# Patient Record
Sex: Male | Born: 1980 | Race: White | Hispanic: No | Marital: Married | Smoking: Never smoker
Health system: Southern US, Community
[De-identification: ages and names within clinical notes are randomized; demographics above are authoritative.]

---

## 2008-02-11 ENCOUNTER — Ambulatory Visit: Payer: Self-pay | Admitting: Family Medicine

## 2008-02-14 LAB — CONVERTED CEMR LAB
Cholesterol: 182 mg/dL (ref 0–200)
Glucose, Bld: 85 mg/dL (ref 70–99)

## 2011-06-09 ENCOUNTER — Other Ambulatory Visit (INDEPENDENT_AMBULATORY_CARE_PROVIDER_SITE_OTHER): Payer: Self-pay

## 2011-06-09 DIAGNOSIS — R5381 Other malaise: Secondary | ICD-10-CM

## 2011-06-09 DIAGNOSIS — R5383 Other fatigue: Secondary | ICD-10-CM

## 2011-06-09 DIAGNOSIS — Z13220 Encounter for screening for lipoid disorders: Secondary | ICD-10-CM

## 2011-06-09 LAB — CBC WITH DIFFERENTIAL/PLATELET
Basophils Relative: 0.6 % (ref 0.0–3.0)
Eosinophils Absolute: 0.1 10*3/uL (ref 0.0–0.7)
Eosinophils Relative: 2.3 % (ref 0.0–5.0)
Hemoglobin: 14.6 g/dL (ref 13.0–17.0)
Lymphocytes Relative: 34.1 % (ref 12.0–46.0)
MCHC: 34.3 g/dL (ref 30.0–36.0)
MCV: 96.6 fl (ref 78.0–100.0)
Monocytes Absolute: 0.4 10*3/uL (ref 0.1–1.0)
Neutro Abs: 2.6 10*3/uL (ref 1.4–7.7)
Neutrophils Relative %: 54.5 % (ref 43.0–77.0)
RBC: 4.39 Mil/uL (ref 4.22–5.81)
WBC: 4.8 10*3/uL (ref 4.5–10.5)

## 2011-06-09 LAB — LIPID PANEL
Cholesterol: 176 mg/dL (ref 0–200)
HDL: 39.5 mg/dL (ref >39.00)
LDL Cholesterol: 110 mg/dL — ABNORMAL HIGH (ref 0–99)
Triglycerides: 134 mg/dL (ref 0.0–149.0)
VLDL: 26.8 mg/dL (ref 0.0–40.0)

## 2011-06-09 LAB — COMPREHENSIVE METABOLIC PANEL
AST: 92 U/L — ABNORMAL HIGH (ref 0–37)
Albumin: 4.2 g/dL (ref 3.5–5.2)
Alkaline Phosphatase: 37 U/L — ABNORMAL LOW (ref 39–117)
BUN: 16 mg/dL (ref 6–23)
Creatinine, Ser: 1 mg/dL (ref 0.4–1.5)
Glucose, Bld: 88 mg/dL (ref 70–99)
Potassium: 4.5 mEq/L (ref 3.5–5.1)

## 2011-06-10 ENCOUNTER — Encounter: Payer: Self-pay | Admitting: Family Medicine

## 2011-06-11 ENCOUNTER — Ambulatory Visit (INDEPENDENT_AMBULATORY_CARE_PROVIDER_SITE_OTHER): Payer: 59 | Admitting: Family Medicine

## 2011-06-11 ENCOUNTER — Encounter: Payer: Self-pay | Admitting: Family Medicine

## 2011-06-11 VITALS — BP 120/80 | HR 76 | Temp 98.7°F | Ht 70.0 in | Wt 202.0 lb

## 2011-06-11 DIAGNOSIS — R7989 Other specified abnormal findings of blood chemistry: Secondary | ICD-10-CM

## 2011-06-11 DIAGNOSIS — Z0000 Encounter for general adult medical examination without abnormal findings: Secondary | ICD-10-CM

## 2011-06-11 DIAGNOSIS — N139 Obstructive and reflux uropathy, unspecified: Secondary | ICD-10-CM

## 2011-06-11 LAB — POCT URINALYSIS DIPSTICK
Glucose, UA: NEGATIVE
Leukocytes, UA: NEGATIVE
Nitrite, UA: NEGATIVE
Protein, UA: NEGATIVE
Urobilinogen, UA: 0.2

## 2011-06-11 NOTE — Patient Instructions (Signed)
REFERRAL: GO THE THE FRONT ROOM AT THE ENTRANCE OF OUR CLINIC, NEAR CHECK IN. ASK FOR Harry Lowe. SHE WILL HELP YOU SET UP YOUR REFERRAL. DATE: TIME:  

## 2011-06-11 NOTE — Progress Notes (Signed)
Patient Name: Harry Lowe Date of Birth: Jul 31, 1980 Age: 31 y.o. Medical Record Number: [id number] Gender: male Date of Encounter: 06/11/2011  History of Present Illness:  Harry Lowe is a 31 y.o. very pleasant male patient who presents with the following:  Preventative Health Maintenance Visit:  Health Maintenance Summary Reviewed and updated, unless pt declines services.  Tobacco History Reviewed. Alcohol: less than a 12 pack in 1 month Exercise Habits: Some activity, rec at least 30 mins 5 times a week STD concerns: no risk or activity to increase risk Drug Use: None Encouraged self-testicular check  Labs reviewed with the patient.   Lipids:    Component Value Date/Time   CHOL 176 06/09/2011 0813   TRIG 134.0 06/09/2011 0813   HDL 39.50 06/09/2011 0813   VLDL 26.8 06/09/2011 0813   CHOLHDL 4 06/09/2011 0813    CBC:    Component Value Date/Time   WBC 4.8 06/09/2011 0813   HGB 14.6 06/09/2011 0813   HCT 42.4 06/09/2011 0813   PLT 141.0* 06/09/2011 0813   MCV 96.6 06/09/2011 0813   NEUTROABS 2.6 06/09/2011 0813   LYMPHSABS 1.6 06/09/2011 0813   MONOABS 0.4 06/09/2011 0813   EOSABS 0.1 06/09/2011 0813   BASOSABS 0.0 06/09/2011 0813    Basic Metabolic Panel:    Component Value Date/Time   NA 141 06/09/2011 0813   K 4.5 06/09/2011 0813   CL 106 06/09/2011 0813   CO2 30 06/09/2011 0813   BUN 16 06/09/2011 0813   CREATININE 1.0 06/09/2011 0813   GLUCOSE 88 06/09/2011 0813   CALCIUM 9.0 06/09/2011 0813    Lab Results  Component Value Date   ALT 67* 06/09/2011   AST 92* 06/09/2011   ALKPHOS 37* 06/09/2011   BILITOT 0.4 06/09/2011    Elevated: LFT's Chol was high on insurance physical. He drinks less than a 12 pack a month. No over-the-counter medications, herbal medications. No history of IV drug use. No history of nasal cocaine in the past or any other nasal medication ingestions. No STD exposure that he knows of or can recall. He is happily married. Monogamous. His liver  function tests were elevated on a recent insurance physical, but previously he is never had any elevation to his knowledge. On his last exam we did not check his liver panel. Excedrin with tylenol at least 2-3 times a week  Outflow restriction? Some sensation like needs to pea, feels like needs to go.  No STD exposure.  For a few months. No change in stream ? If something remains in his bladder This mostly happens at nighttime when he tries to empty his bladder. He will have to get up multiple times at bedtime, often 3, 4, or 5 times to try to get his bladder, and much of the time he feels as if he is not able to fully and his bladder. This is new and has been ongoing for the last 3-6 months.  Less than a 12 pack a month Has been taking some excedrin 2-3 times a week Daily vitamin No IV drugs.    Past Medical History, Surgical History, Social History, Family History, Problem List, Medications, and Allergies have been reviewed and updated if relevant.  Review of Systems:  General: Denies fever, chills, sweats. No significant weight loss. Eyes: Denies blurring,significant itching ENT: Denies earache, sore throat, and hoarseness. Cardiovascular: Denies chest pains, palpitations, dyspnea on exertion Respiratory: Denies cough, dyspnea at rest,wheeezing Breast: no concerns about lumps GI: Denies nausea, vomiting, diarrhea, constipation,  change in bowel habits, abdominal pain, melena, hematochezia GU: Denies penile discharge, ED, urinary flow / outflow problems. No STD concerns. Musculoskeletal: Denies back pain, joint pain Derm: Denies rash, itching Neuro: Denies  paresthesias, frequent falls, frequent headaches Psych: Denies depression, anxiety Endocrine: Denies cold intolerance, heat intolerance, polydipsia Heme: Denies enlarged lymph nodes Allergy: No hayfever   Physical Examination: Filed Vitals:   06/11/11 1437  BP: 120/80  Pulse: 76  Temp: 98.7 F (37.1 C)  TempSrc: Oral    Height: 5\' 10"  (1.778 m)  Weight: 202 lb (91.627 kg)    Body mass index is 28.98 kg/(m^2).   Wt Readings from Last 3 Encounters:  06/11/11 202 lb (91.627 kg)  02/11/08 179 lb 6.1 oz (81.367 kg)    GEN: well developed, well nourished, no acute distress Eyes: conjunctiva and lids normal, PERRLA, EOMI ENT: TM clear, nares clear, oral exam WNL Neck: supple, no lymphadenopathy, no thyromegaly, no JVD Pulm: clear to auscultation and percussion, respiratory effort normal CV: regular rate and rhythm, S1-S2, no murmur, rub or gallop, no bruits, peripheral pulses normal and symmetric, no cyanosis, clubbing, edema or varicosities Chest: no scars, masses GI: soft, non-tender; no hepatosplenomegaly, masses; active bowel sounds all quadrants GU: no hernia, testicular mass, penile discharge Lymph: no cervical, axillary or inguinal adenopathy MSK: gait normal, muscle tone and strength WNL, no joint swelling, effusions, discoloration, crepitus  SKIN: clear, good turgor, color WNL, no rashes, lesions, or ulcerations Neuro: normal mental status, normal strength, sensation, and motion Psych: alert; oriented to person, place and time, normally interactive and not anxious or depressed in appearance.   Assessment and Plan:  1. Routine general medical examination at a health care facility    2. Elevated LFTs : unclear cause. Initiate hepatitis workup. Ultrasound of the abdomen to evaluate for potential  Elevation in liver function tests. Advised weight for potential fatty liver or other occult change in the liver texture that could be causing an elevation in liver function testing. Hepatitis B core antibody, IgM, Hepatitis B surface antibody, Hepatitis B surface antigen, Hepatitis C antibody, US Abdomen Complete, POCT urinalysis dipstick  3. Urinary outflow obstruction : historically, sounds like a  Outflow obstruction, intermittently causing the patient some significant symptoms. Ambulatory referral to  Urology    Orders Today: Orders Placed This Encounter  Procedures  . US Abdomen Complete    Standing Status: Future     Number of Occurrences:      Standing Expiration Date: 08/10/2012    202 LB/NO NEEDS PER OFFICE/NOT DIABETIC/INS/UHC/TA/[facility] W/EPIC ORDER    Order Specific Question:  Reason for exam:    Answer:  elevated lft's, eval liver and gallbladder    Order Specific Question:  Preferred imaging location?    Answer:  [facility]  . Hepatitis B core antibody, IgM  . Hepatitis B surface antibody  . Hepatitis B surface antigen  . Hepatitis C antibody  . Ambulatory referral to Urology    Referral Priority:  Routine    Referral Type:  Consultation    Referral Reason:  Specialty Services Required    Requested Specialty:  Urology    Number of Visits Requested:  1  . POCT urinalysis dipstick   Urinalysis    Component Value Date/Time   BILIRUBINUR neg 06/11/2011 1538   UROBILINOGEN 0.2 06/11/2011 1538   NITRITE neg 06/11/2011 1538   LEUKOCYTESUR Negative 06/11/2011 1538   The patient's preventative maintenance and recommended screening tests for an annual wellness exam were  reviewed in full today. Brought up to date unless services declined.  Counselled on the importance of diet, exercise, and its role in overall health and mortality. The patient's FH and SH was reviewed, including their home life, tobacco status, and drug and alcohol status.

## 2011-06-12 LAB — HEPATITIS B SURFACE ANTIBODY,QUALITATIVE: Hep B S Ab: POSITIVE — AB

## 2011-06-12 LAB — HEPATITIS B SURFACE ANTIGEN: Hepatitis B Surface Ag: NEGATIVE

## 2011-06-18 ENCOUNTER — Other Ambulatory Visit: Payer: 59

## 2011-06-25 ENCOUNTER — Ambulatory Visit
Admission: RE | Admit: 2011-06-25 | Discharge: 2011-06-25 | Disposition: A | Discharge status: Home / Self Care | Payer: 59 | Source: Ambulatory Visit | Attending: Family Medicine | Admitting: Family Medicine

## 2011-07-16 ENCOUNTER — Telehealth: Payer: Self-pay

## 2011-07-16 NOTE — Telephone Encounter (Signed)
Plain guaifenesin should be fine

## 2011-07-16 NOTE — Telephone Encounter (Signed)
Pt has elevated liver enzymes and is cautious with OTC meds. Pt has head congestion, raw throat due to drainage back of throat, occasional non productive cough, no fever. Pt not using nasal saline or any med. Pt wants Dr Brayton Layman advice of OTC he could take. Pts pharmacy [facility] if pharmacy needed. Pt contact # [phone].

## 2011-07-16 NOTE — Telephone Encounter (Signed)
Patient advised of recommendations.  

## 2011-07-25 ENCOUNTER — Other Ambulatory Visit: Payer: Self-pay | Admitting: Family Medicine

## 2011-07-25 DIAGNOSIS — R748 Abnormal levels of other serum enzymes: Secondary | ICD-10-CM

## 2011-07-31 ENCOUNTER — Other Ambulatory Visit: Payer: 59

## 2011-08-01 ENCOUNTER — Other Ambulatory Visit (INDEPENDENT_AMBULATORY_CARE_PROVIDER_SITE_OTHER): Payer: 59

## 2011-08-01 DIAGNOSIS — R748 Abnormal levels of other serum enzymes: Secondary | ICD-10-CM

## 2011-08-01 DIAGNOSIS — R7401 Elevation of levels of liver transaminase levels: Secondary | ICD-10-CM

## 2011-08-01 LAB — HEPATIC FUNCTION PANEL
AST: 34 U/L (ref 0–37)
Albumin: 4.4 g/dL (ref 3.5–5.2)
Alkaline Phosphatase: 47 U/L (ref 39–117)
Bilirubin, Direct: 0 mg/dL (ref 0.0–0.3)

## 2011-08-04 ENCOUNTER — Encounter: Payer: Self-pay | Admitting: *Deleted

## 2011-09-29 ENCOUNTER — Encounter: Payer: Self-pay | Admitting: Radiology

## 2011-09-29 DIAGNOSIS — R748 Abnormal levels of other serum enzymes: Secondary | ICD-10-CM | POA: Insufficient documentation

## 2011-09-29 DIAGNOSIS — R945 Abnormal results of liver function studies: Secondary | ICD-10-CM | POA: Insufficient documentation

## 2011-09-29 DIAGNOSIS — R7989 Other specified abnormal findings of blood chemistry: Secondary | ICD-10-CM | POA: Insufficient documentation

## 2012-07-24 IMAGING — US US ABDOMEN COMPLETE
1 series · 14 of 25 positions shown · non-contrast
Comparison: None.

CLINICAL DATA: Elevated LFTs, evaluate liver and gallbladder

COMPLETE ABDOMINAL ULTRASOUND

[Series 1: us abdomen complete · 0.26mm/px · 14 of 59 slices shown]
[im 1/59]
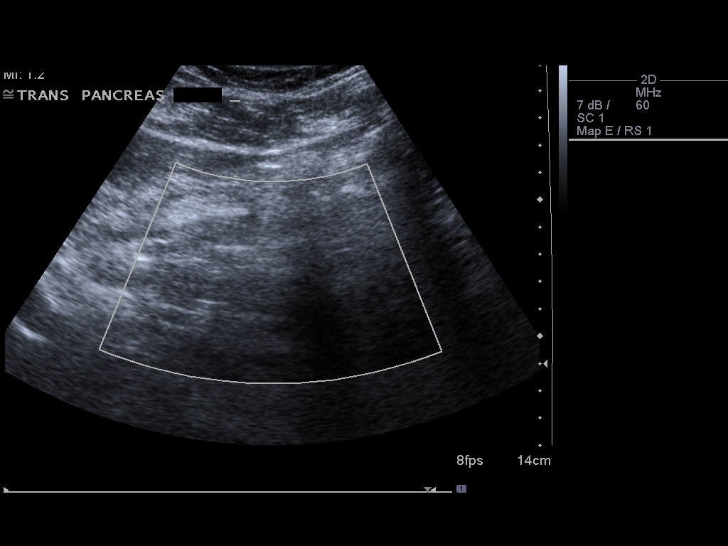
[im 5/59]
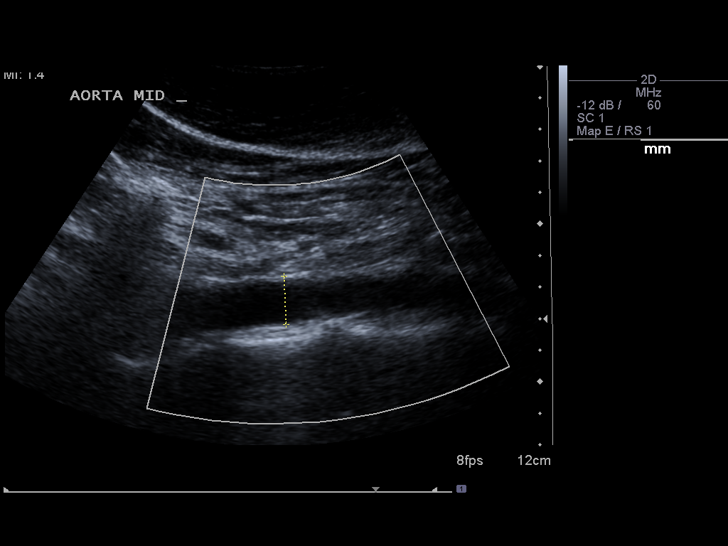
[im 10/59]
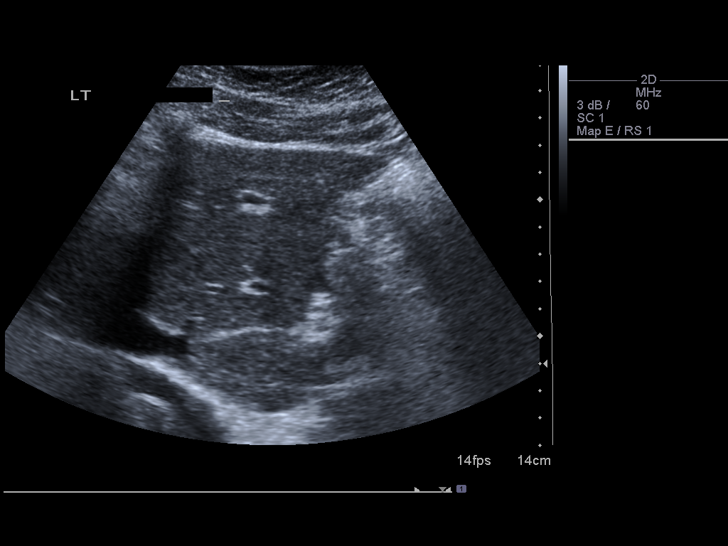
[im 15/59]
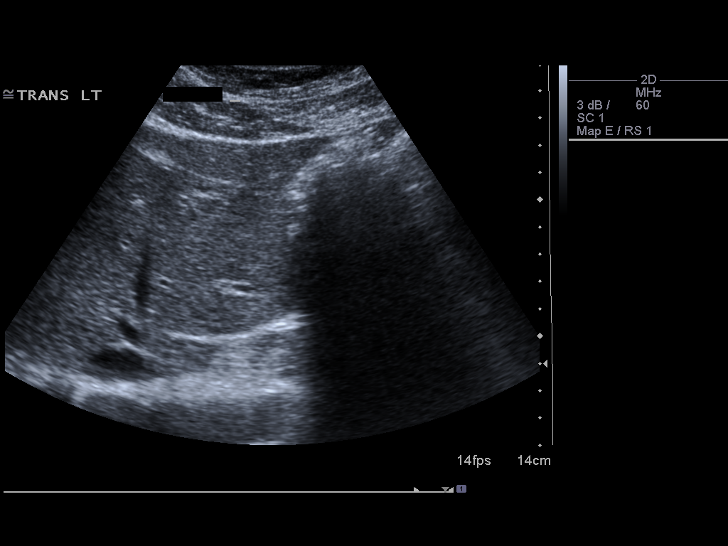
[im 20/59]
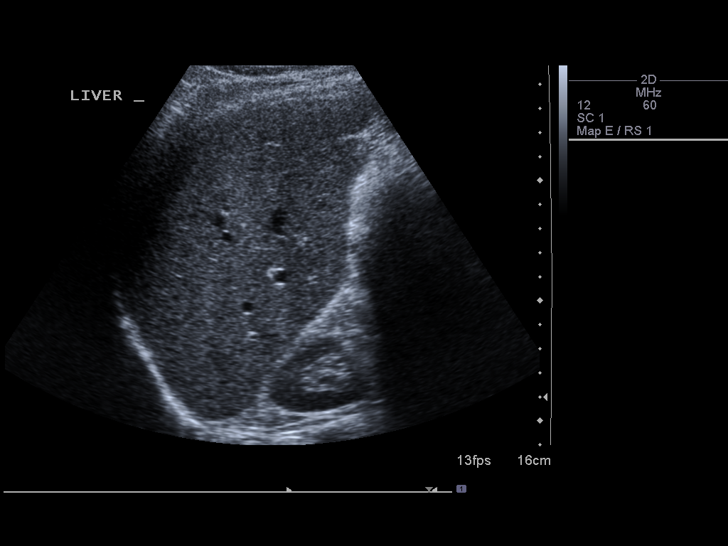
[im 22/59]
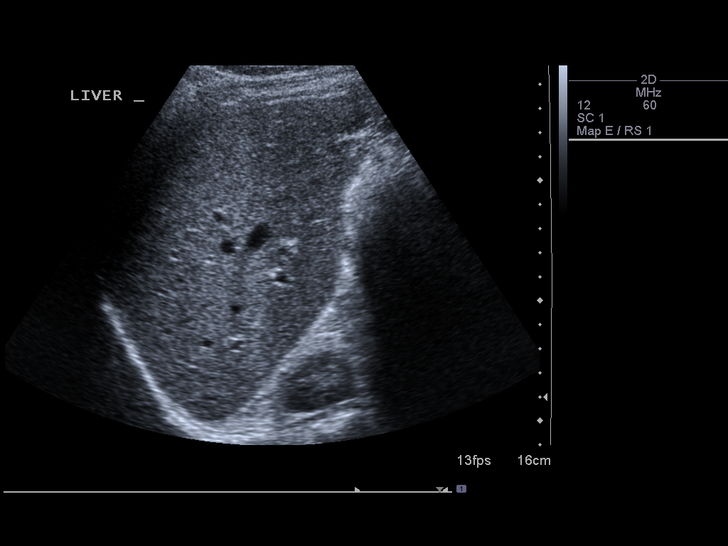
[im 27/59]
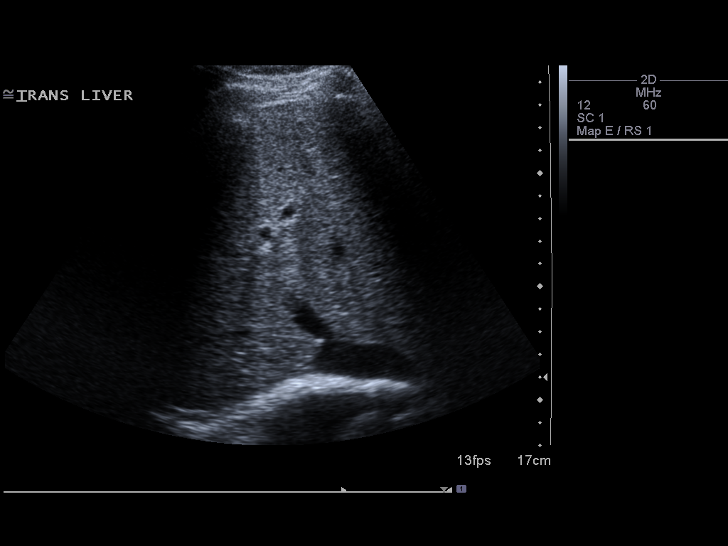
[im 32/59]
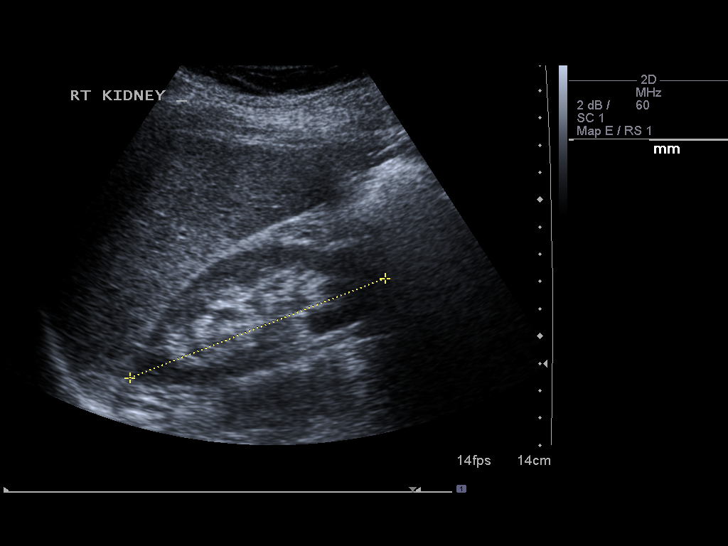
[im 37/59]
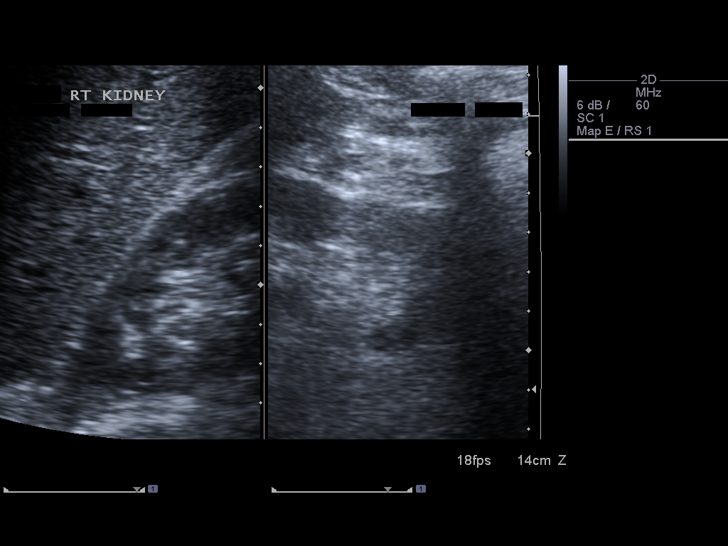
[im 39/59]
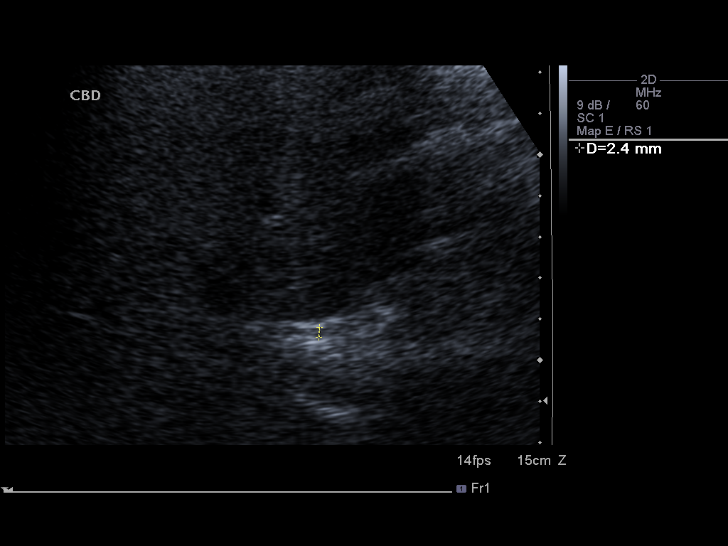
[im 44/59]
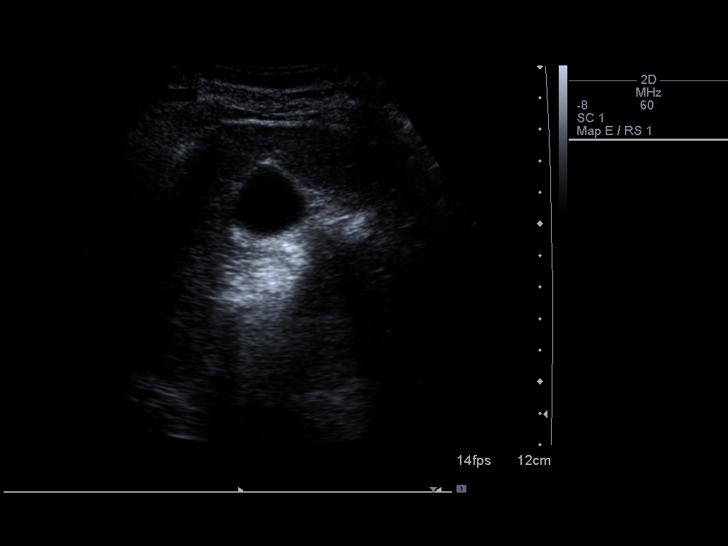
[im 49/59]
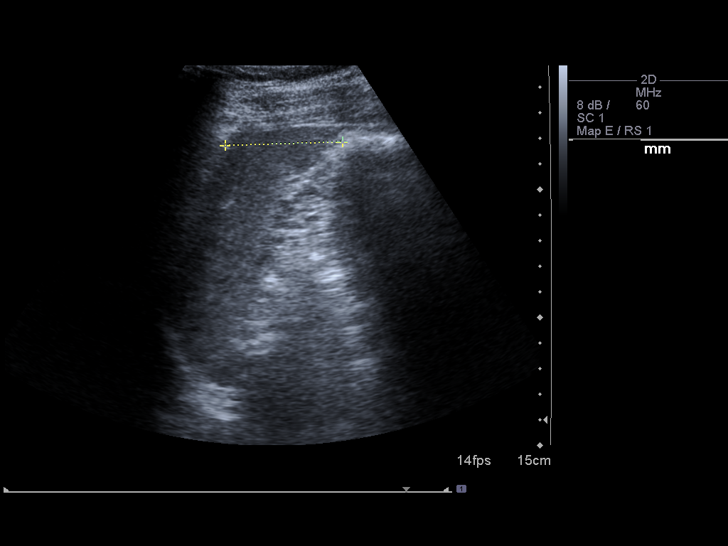
[im 54/59]
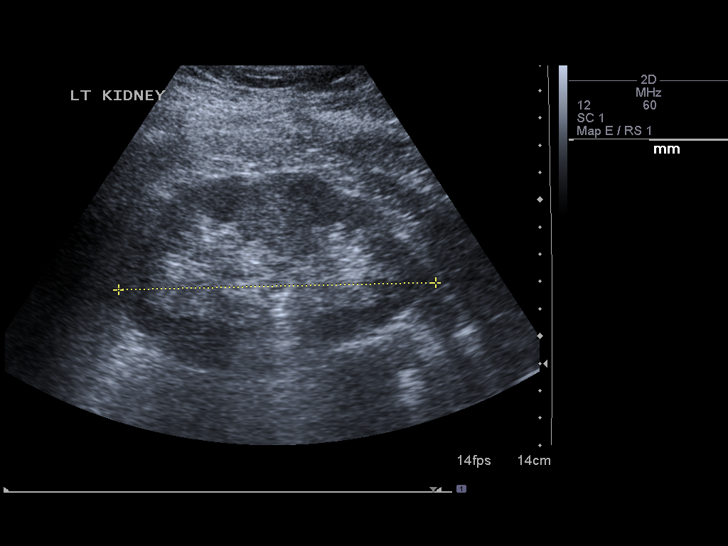
[im 59/59]
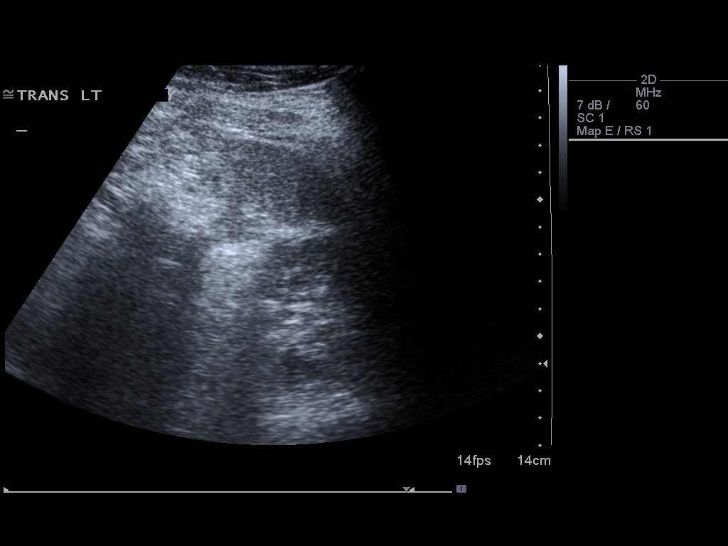

[14 of 25 positions shown; findings below may reference images not displayed]

FINDINGS: Gallbladder:  No gallstones, gallbladder wall thickening, or
pericholecystic fluid. No sonographic Murphy's sign.

Common bile duct:  Measures 2.4 mm in diameter within normal
limits.

Liver:  No focal lesion identified.  Within normal limits in
parenchymal echogenicity.

IVC:  Appears normal.

Pancreas: Limited assessment not well visualized due to abundant
bowel gas.

Spleen:  Measures 4.6 cm in length.  Normal echogenicity.

Right Kidney:  Measures 10 cm in length.  No mass, hydronephrosis
or diagnostic renal calculus

Left Kidney:  Measures 11.6 cm in length.  No mass, hydronephrosis
or diagnostic renal calculus

Abdominal aorta:  No aneurysm identified. Abdominal aorta measures
up to 1.8 cm in diameter.
IMPRESSION: Negative abdominal ultrasound. The pancreas is not visualized due
to abundant bowel gas.

## 2013-11-23 ENCOUNTER — Encounter (INDEPENDENT_AMBULATORY_CARE_PROVIDER_SITE_OTHER): Payer: Self-pay

## 2013-11-23 ENCOUNTER — Ambulatory Visit (INDEPENDENT_AMBULATORY_CARE_PROVIDER_SITE_OTHER): Payer: 59 | Admitting: Family Medicine

## 2013-11-23 ENCOUNTER — Encounter: Payer: Self-pay | Admitting: Family Medicine

## 2013-11-23 VITALS — BP 124/80 | HR 60 | Temp 98.3°F | Ht 70.0 in | Wt 218.0 lb

## 2013-11-23 DIAGNOSIS — J301 Allergic rhinitis due to pollen: Secondary | ICD-10-CM

## 2013-11-23 NOTE — Progress Notes (Signed)
   Dr. Karleen Hampshire T. Bobie Kistler, MD, CAQ [facility] [address] [place], [address] Phone: [phone] Fax: [phone]  11/23/2013  Patient: Harry Lowe, MRN: [id number], DOB: February 23, 1981  Primary Physician:  Hannah Beat, MD  Chief Complaint: fill out forms for work  Subjective:   Harry Lowe is a 33 y.o. very pleasant male patient who presents with the following:  Here for allergies, which are flared up. Also with some forms for work.  Past Medical History, Surgical History, Social History, Family History, Problem List, Medications, and Allergies have been reviewed and updated if relevant.   GEN: No acute illnesses, no fevers, chills. GI: No n/v/d, eating normally Pulm: No SOB Interactive and getting along well at home.  Otherwise, ROS is as per the HPI.  Objective:   BP 124/80  Pulse 60  Temp(Src) 98.3 F (36.8 C) (Oral)  Ht  (1.778 m)  Wt 218 lb (98.884 kg)  BMI 31.28 kg/m2  SpO2 97%  GEN: WDWN, NAD, Non-toxic, A & O x 3 HEENT: Atraumatic, Normocephalic. Neck supple. No masses, No LAD. Ears and Nose: No external deformity. CV: RRR, No M/G/R. No JVD. No thrill. No extra heart sounds. PULM: CTA B, no wheezes, crackles, rhonchi. No retractions. No resp. distress. No accessory muscle use. EXTR: No c/c/e NEURO Normal gait.  PSYCH: Normally interactive. Conversant. Not depressed or anxious appearing.  Calm demeanor.   Laboratory and Imaging Data:  Assessment and Plan:   Allergic rhinitis due to pollen  Also filled out paperwork  ALLERGIES -Sneezing, runny and stuffy nose, itchy eyes, nose, throat, tears.  TREATMENT 1. Avoid offending allergen 2. Air filtration devices can reduce concentration of allergen 3. Intranasal steroid sprays are the most effective control (Flonase and Nasacort are over the counter now.) 4. Anti-histamines. Claritin (generic loratadine), Zyrtec (generic certrizine), and Allegra  (fexofenadine) are all over the counter now. Cheap at [employer], [employer], [employer]. Pick 1 - sometimes it is helpful to try and see which one helps you the most or vary it up some over time. 5. You can also do an extra tablet of Benadryl 25 mg at night if needed. It makes you somewhat drowsy. 6. Presciption anti-histamines are available if the above fail   Signed,  Yavuz Kirby T. Daneli Butkiewicz, MD   Patient's Medications  New Prescriptions   No medications on file  Previous Medications   MULTIPLE VITAMIN (MULTIVITAMIN) TABLET    Take 1 tablet by mouth daily.  Modified Medications   No medications on file  Discontinued Medications   No medications on file

## 2013-11-23 NOTE — Progress Notes (Signed)
Pre visit review using our clinic review tool, if applicable. No additional management support is needed unless otherwise documented below in the visit note.
